# Patient Record
Sex: Female | Born: 1975 | Hispanic: Yes | Marital: Married | Smoking: Never smoker
Health system: Southern US, Community
[De-identification: ages and names within clinical notes are randomized; demographics above are authoritative.]

## PROBLEM LIST (undated history)

## (undated) HISTORY — PX: BREAST EXCISIONAL BIOPSY: SUR124

---

## 2001-07-27 HISTORY — PX: BREAST BIOPSY: SHX20

## 2005-05-02 ENCOUNTER — Inpatient Hospital Stay: Payer: Self-pay | Admitting: Obstetrics and Gynecology

## 2017-09-20 ENCOUNTER — Ambulatory Visit: Payer: Self-pay | Attending: Oncology | Admitting: *Deleted

## 2017-09-20 ENCOUNTER — Ambulatory Visit
Admission: RE | Admit: 2017-09-20 | Discharge: 2017-09-20 | Disposition: A | Discharge status: Home / Self Care | Payer: Self-pay | Source: Ambulatory Visit | Attending: Oncology | Admitting: Oncology

## 2017-09-20 ENCOUNTER — Encounter (INDEPENDENT_AMBULATORY_CARE_PROVIDER_SITE_OTHER): Payer: Self-pay

## 2017-09-20 VITALS — BP 110/79 | HR 76 | Temp 98.9°F | Ht 62.0 in | Wt 143.0 lb

## 2017-09-20 DIAGNOSIS — Z0000 Encounter for general adult medical examination without abnormal findings: Secondary | ICD-10-CM

## 2017-09-20 NOTE — Patient Instructions (Signed)
HPV Test The human papillomavirus (HPV) test is used to look for high-risk types of HPV infection. HPV is a group of about 100 viruses. Many of these viruses cause growths on, in, or around the genitals. Most HPV viruses cause infections that usually go away without treatment. However, HPV types 6, 11, 16, and 18 are considered high-risk types of HPV that can increase your risk of cancer of the cervix or anus if the infection is left untreated. An HPV test identifies the DNA (genetic) strands of the HPV infection, so it is also referred to as the HPV DNA test. Although HPV is found in both males and females, the HPV test is only used to screen for increased cancer risk in females:  With an abnormal Pap test.  After treatment of an abnormal Pap test.  Between the ages of 30 and 65.  After treatment of a high-risk HPV infection.  The HPV test may be done at the same time as a pelvic exam and Pap test in females over the age of 30. Both the HPV test and Pap test require a sample of cells from the cervix. How do I prepare for this test?  Do not douche or take a bath for 24-48 hours before the test or as directed by your health care provider.  Do not have sex for 24-48 hours before the test or as directed by your health care provider.  You may be asked to reschedule the test if you are menstruating.  You will be asked to urinate before the test. What do the results mean? It is your responsibility to obtain your test results. Ask the lab or department performing the test when and how you will get your results. Talk with your health care provider if you have any questions about your results. Your result will be negative or positive. Meaning of Negative Test Results A negative HPV test result means that no HPV was found, and it is very likely that you do not have HPV. Meaning of Positive Test Results A positive HPV test result indicates that you have HPV.  If your test result shows the presence  of any high-risk HPV strains, you may have an increased risk of developing cancer of the cervix or anus if the infection is left untreated.  If any low-risk HPV strains are found, you are not likely to have an increased risk of cancer.  Discuss your test results with your health care provider. He or she will use the results to make a diagnosis and determine a treatment plan that is right for you. Talk with your health care provider to discuss your results, treatment options, and if necessary, the need for more tests. Talk with your health care provider if you have any questions about your results. This information is not intended to replace advice given to you by your health care provider. Make sure you discuss any questions you have with your health care provider. Document Released: 08/07/2004 Document Revised: 03/18/2016 Document Reviewed: 11/28/2013 Elsevier Interactive Patient Education  2018 Elsevier Inc.   Gave patient hand-out, Women Staying Healthy, Active and Well from [facility], with education on breast health, pap smears, heart and colon health.  

## 2017-09-20 NOTE — Progress Notes (Signed)
Subjective:     Patient ID: Orion ModestGilma E Figueroa Castillo, female   DOB: Feb 09, 1976, 42 y.o.   MRN: [id number]  HPI   Review of Systems     Objective:   Physical Exam  Pulmonary/Chest: Right breast exhibits no inverted nipple, no mass, no nipple discharge, no skin change and no tenderness. Left breast exhibits no inverted nipple, no mass, no nipple discharge, no skin change and no tenderness. Breasts are symmetrical.  Abdominal: There is no splenomegaly or hepatomegaly.  Genitourinary: No labial fusion. There is no rash, tenderness, lesion or injury on the right labia. There is no rash, tenderness, lesion or injury on the left labia. Cervix exhibits no motion tenderness, no discharge and no friability. Right adnexum displays no mass, no tenderness and no fullness. Left adnexum displays no mass, no tenderness and no fullness. No erythema, tenderness or bleeding in the vagina. No signs of injury around the vagina. No vaginal discharge found.         Assessment:     42 year old English speaking [place] female [facility] breast exam, pap and mammogram.  Clinical breast exam unremarkable.  Taught self breast awareness.  [place].  Those images were not available per Asher MuirJamie in the breast center.  Last pap per patient was in 2016 or 2017 at [facility].  Per Joellyn Quailshristy Burton, Becky at the health department states a wet prep was obtained in 2016.  Specimen collected for pap smear without difficulty.  Patient has been screened for eligibility.  She does not have any insurance, Medicare or Medicaid.  She also meets financial eligibility.  Hand-out given on the Affordable Care Act.    Plan:     Screening mammogram ordered.  Specimen for pap sent to the lab.  Will follow-up per [facility] protocol.

## 2017-09-23 LAB — PAP LB AND HPV HIGH-RISK
HPV, high-risk: NEGATIVE
PAP SMEAR COMMENT: 0

## 2017-09-24 ENCOUNTER — Other Ambulatory Visit: Payer: Self-pay

## 2017-09-24 DIAGNOSIS — N63 Unspecified lump in unspecified breast: Secondary | ICD-10-CM

## 2017-10-01 ENCOUNTER — Ambulatory Visit
Admission: RE | Admit: 2017-10-01 | Discharge: 2017-10-01 | Disposition: A | Discharge status: Home / Self Care | Payer: Self-pay | Source: Ambulatory Visit | Attending: Oncology | Admitting: Oncology

## 2017-10-01 DIAGNOSIS — N63 Unspecified lump in unspecified breast: Secondary | ICD-10-CM

## 2017-10-25 ENCOUNTER — Other Ambulatory Visit: Payer: Self-pay | Admitting: *Deleted

## 2017-10-25 ENCOUNTER — Encounter: Payer: Self-pay | Admitting: *Deleted

## 2017-10-25 DIAGNOSIS — N63 Unspecified lump in unspecified breast: Secondary | ICD-10-CM

## 2017-10-25 NOTE — Progress Notes (Signed)
Letter mailed to inform patient of her normal pap smear, and appointment for her 6 month follow-up ultrasound on 04/05/18 @ 9:20.  HSIS to Winkelmanhristy.

## 2018-04-05 ENCOUNTER — Ambulatory Visit
Admission: RE | Admit: 2018-04-05 | Discharge: 2018-04-05 | Disposition: A | Discharge status: Home / Self Care | Payer: Self-pay | Source: Ambulatory Visit | Attending: Oncology | Admitting: Oncology

## 2018-04-05 DIAGNOSIS — N63 Unspecified lump in unspecified breast: Secondary | ICD-10-CM | POA: Insufficient documentation

## 2018-04-18 ENCOUNTER — Other Ambulatory Visit: Payer: Self-pay | Admitting: *Deleted

## 2018-04-18 ENCOUNTER — Encounter: Payer: Self-pay | Admitting: *Deleted

## 2018-04-18 DIAGNOSIS — N63 Unspecified lump in unspecified breast: Secondary | ICD-10-CM

## 2018-04-18 NOTE — Progress Notes (Unsigned)
Letter mailed to inform patient of her next [facility] appointment on 10/04/17 @ 9:30 and need for next mammogram.  HSIS to Cull[place]risty.

## 2018-10-05 ENCOUNTER — Ambulatory Visit: Payer: Self-pay | Attending: Oncology | Admitting: *Deleted

## 2018-10-05 ENCOUNTER — Ambulatory Visit
Admission: RE | Admit: 2018-10-05 | Discharge: 2018-10-05 | Disposition: A | Discharge status: Home / Self Care | Payer: Self-pay | Source: Ambulatory Visit | Attending: Oncology | Admitting: Oncology

## 2018-10-05 ENCOUNTER — Other Ambulatory Visit: Payer: Self-pay

## 2018-10-05 ENCOUNTER — Encounter: Payer: Self-pay | Admitting: *Deleted

## 2018-10-05 VITALS — BP 118/73 | HR 76 | Temp 98.4°F | Ht 62.25 in | Wt 147.2 lb

## 2018-10-05 DIAGNOSIS — N63 Unspecified lump in unspecified breast: Secondary | ICD-10-CM

## 2018-10-05 NOTE — Patient Instructions (Signed)
Gave patient hand-out, Women Staying Healthy, Active and Well from [facility], with education on breast health, pap smears, heart and colon health. 

## 2018-10-05 NOTE — Progress Notes (Signed)
  Subjective:     Patient ID: Yolanda Parsons, female   DOB: Feb 12, 1976, 43 y.o.   MRN: [id number]  HPI   Review of Systems     Objective:   Physical Exam Chest:     Breasts:        Right: No swelling, bleeding, inverted nipple, mass, nipple discharge, skin change or tenderness.        Left: No swelling, bleeding, inverted nipple, mass, nipple discharge, skin change or tenderness.    Lymphadenopathy:     Upper Body:     Right upper body: No supraclavicular or axillary adenopathy.     Left upper body: No supraclavicular or axillary adenopathy.        Assessment:     43 year old English speaking Hispanic female returns to [facility] for annual screening.  Clinical breast exam with bilateral thickening in the upper outer quadrants.  Patient is currently being followed with 6 month ultrasounds for a left breast mass at 1:00.  There is no dominant mass, skin changes, nipple discharge or lymphadenopathy.  Taught self breast awareness.  Last pap on 09/20/17 was negative / negative.  Next pap due in 2024.  Patient has been screened for eligibility.  She does not have any insurance, Medicare or Medicaid.  She also meets financial eligibility.  Hand-out given on the Affordable Care Act.  Risk Assessment    Risk Scores      10/05/2018   Last edited by: Alta Corning, CMA   5-year risk:    Lifetime risk:             Plan:     Bilateral diagnostic mammogram and ultrasound ordered for 6 month follow and annual screening of a left breast mass.  Will follow up per [facility] protocol.

## 2018-10-06 ENCOUNTER — Other Ambulatory Visit: Payer: Self-pay | Admitting: *Deleted

## 2018-10-06 ENCOUNTER — Encounter: Payer: Self-pay | Admitting: *Deleted

## 2018-10-06 DIAGNOSIS — N63 Unspecified lump in unspecified breast: Secondary | ICD-10-CM

## 2018-10-06 NOTE — Progress Notes (Signed)
Letter mailed from [facility] to inform patient of her mammogram results.  Patient is to follow-up in one year with annual diagnostic mammogram.  Yolanda Parsons to put in tickler file to schedule for annual appointment in one year.

## 2019-10-15 ENCOUNTER — Ambulatory Visit: Payer: Self-pay | Attending: Internal Medicine

## 2019-10-15 DIAGNOSIS — Z23 Encounter for immunization: Secondary | ICD-10-CM

## 2019-10-15 NOTE — Progress Notes (Signed)
   Covid-19 Vaccination Clinic  Name:  Yolanda Parsons    MRN: [id number] DOB: [birth year]  10/15/2019  Yolanda Parsons was observed post Covid-19 immunization for 15 minutes without incident. She was provided with Vaccine Information Sheet and instruction to access the V-Safe system.   Yolanda Parsons was instructed to call 911 with any severe reactions post vaccine: Marland Kitchen Difficulty breathing  . Swelling of face and throat  . A fast heartbeat  . A bad rash all over body  . Dizziness and weakness   Immunizations Administered    Name Date Dose VIS Date Route   Pfizer COVID-19 Vaccine 10/15/2019  1:55 PM 0.3 mL 07/07/2019 Intramuscular   Manufacturer: [employer], [employer]   Lot: [id number]   NDC: [address]-2

## 2019-11-05 ENCOUNTER — Ambulatory Visit: Payer: Self-pay | Attending: Internal Medicine

## 2019-11-05 DIAGNOSIS — Z23 Encounter for immunization: Secondary | ICD-10-CM

## 2019-11-05 NOTE — Progress Notes (Signed)
   Covid-19 Vaccination Clinic  Name:  Yolanda Parsons    MRN: [id number] DOB: 06-04-1976  11/05/2019  [name] was observed post Covid-19 immunization for 15 minutes without incident. She was provided with Vaccine Information Sheet and instruction to access the V-Safe system.   [name] was instructed to call 911 with any severe reactions post vaccine: Marland Kitchen Difficulty breathing  . Swelling of face and throat  . A fast heartbeat  . A bad rash all over body  . Dizziness and weakness   Immunizations Administered    Name Date Dose VIS Date Route   Pfizer COVID-19 Vaccine 11/05/2019  1:03 PM 0.3 mL 07/07/2019 Intramuscular   Manufacturer: [employer], [employer]   Lot: [id number]   NDC: [address]-8

## 2020-04-14 IMAGING — MG DIGITAL DIAGNOSTIC BILATERAL MAMMOGRAM WITH TOMO AND CAD
8 series · 8 of 24 positions shown · non-contrast
Comparison: Previous exam(s).

CLINICAL DATA: 42-year-old female for 1 year follow-up of LEFT
breast mass and for annual bilateral mammogram.

EXAM:
DIGITAL DIAGNOSTIC BILATERAL MAMMOGRAM WITH CAD AND TOMO
ULTRASOUND LEFT BREAST

[L MLO synth-2D]
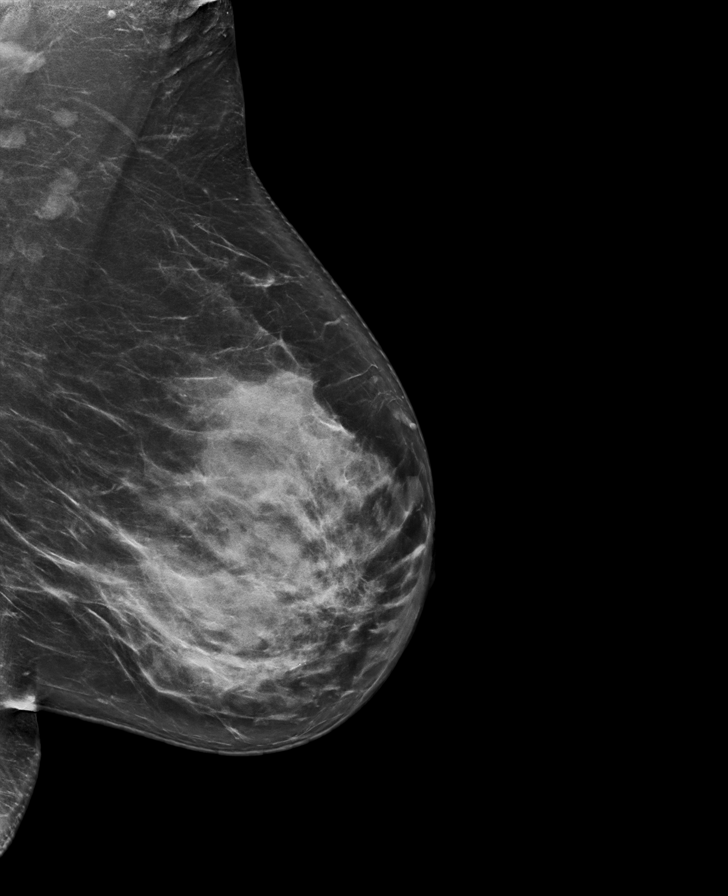

[R MLO synth-2D]
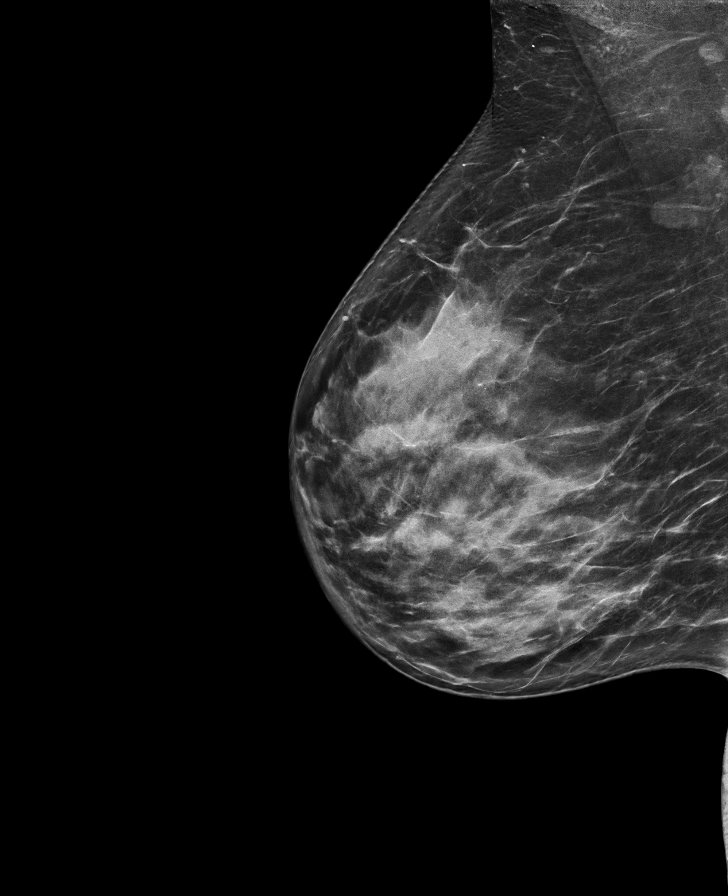

[L CC synth-2D]
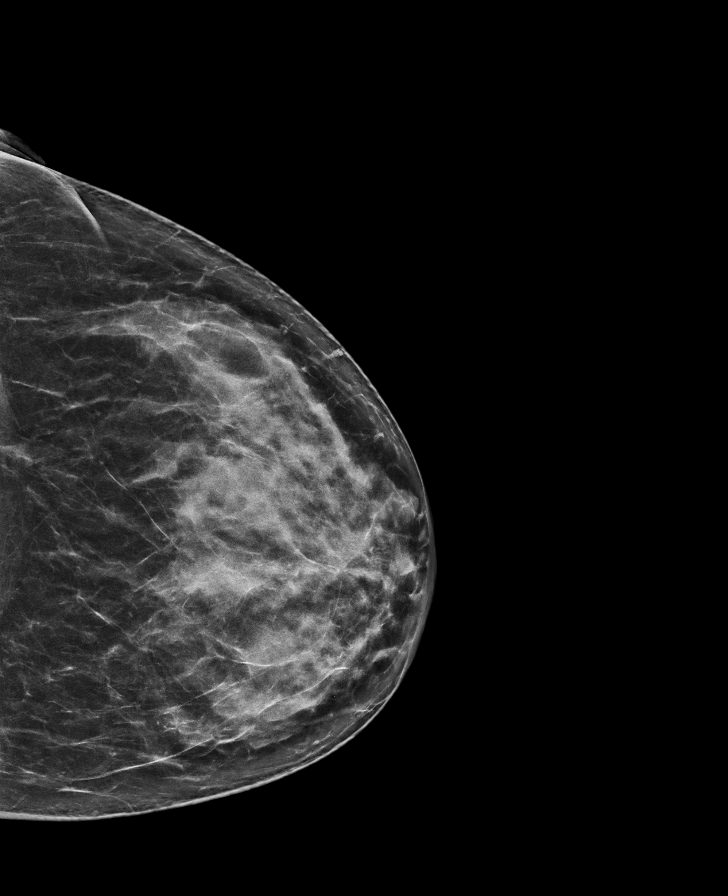

[R CC synth-2D]
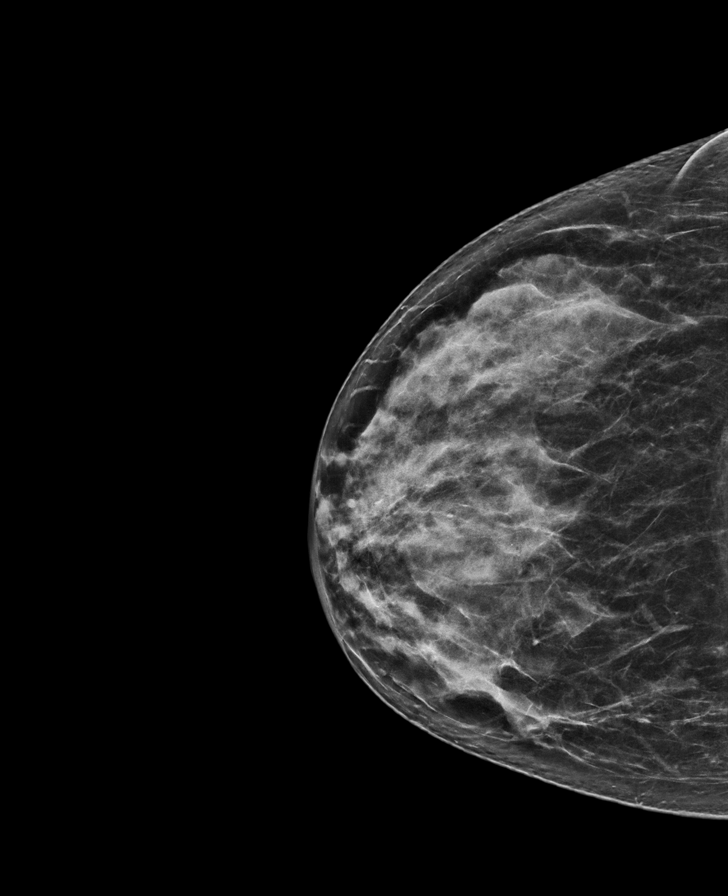

[L MLO tomo · tomo slice 43/85.0]
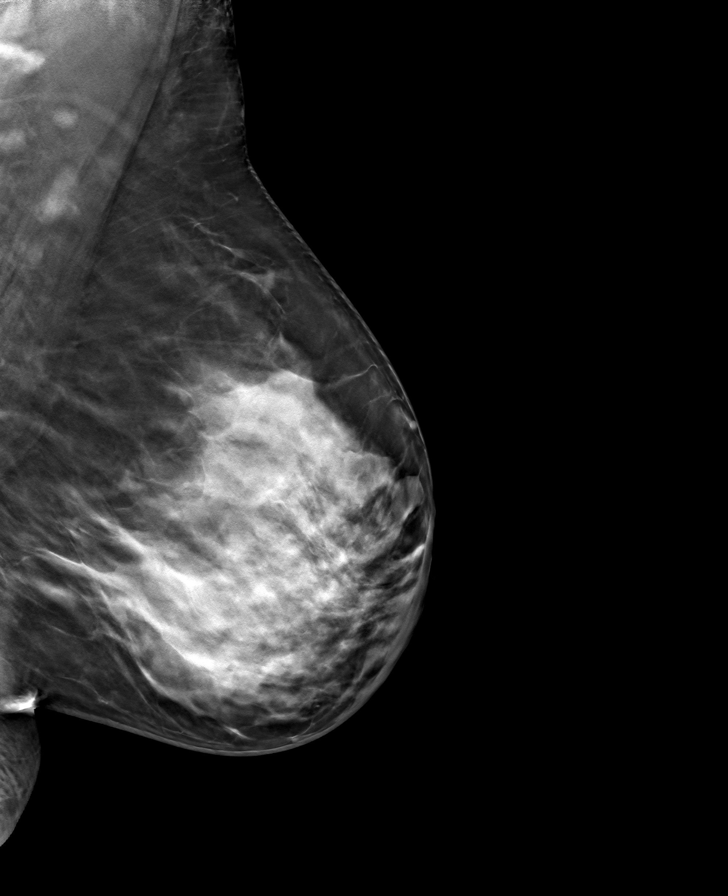

[L CC tomo · tomo slice 40/79.0]
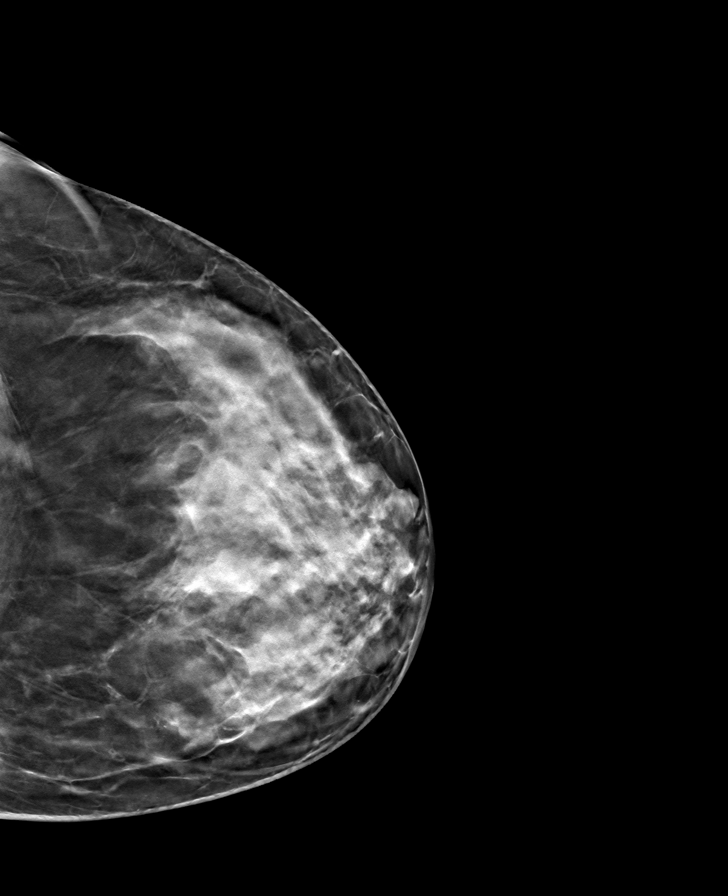

[R MLO tomo · tomo slice 39/77.0]
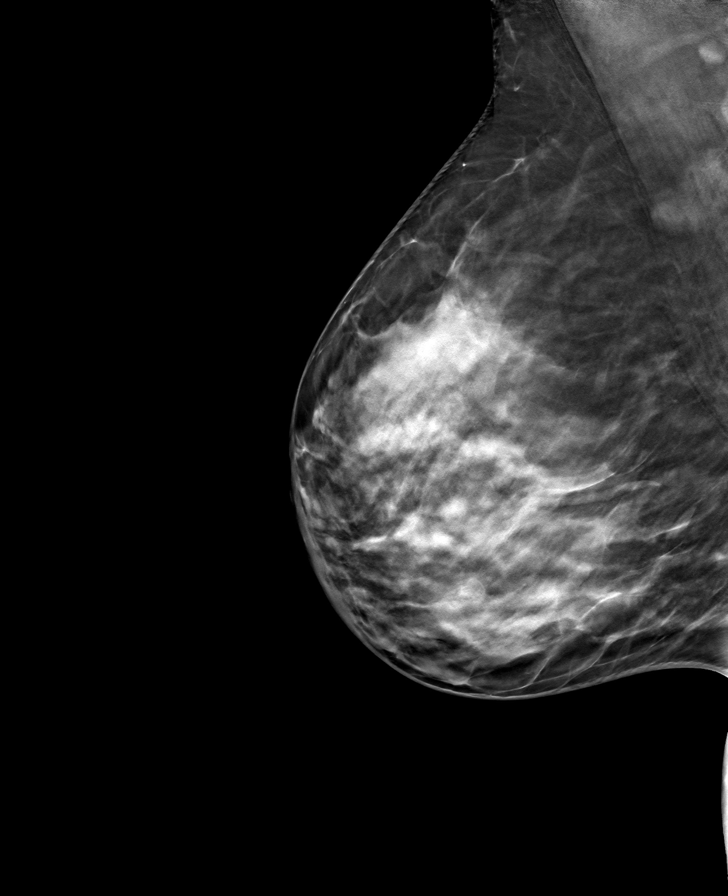

[R CC tomo · tomo slice 37/73.0]
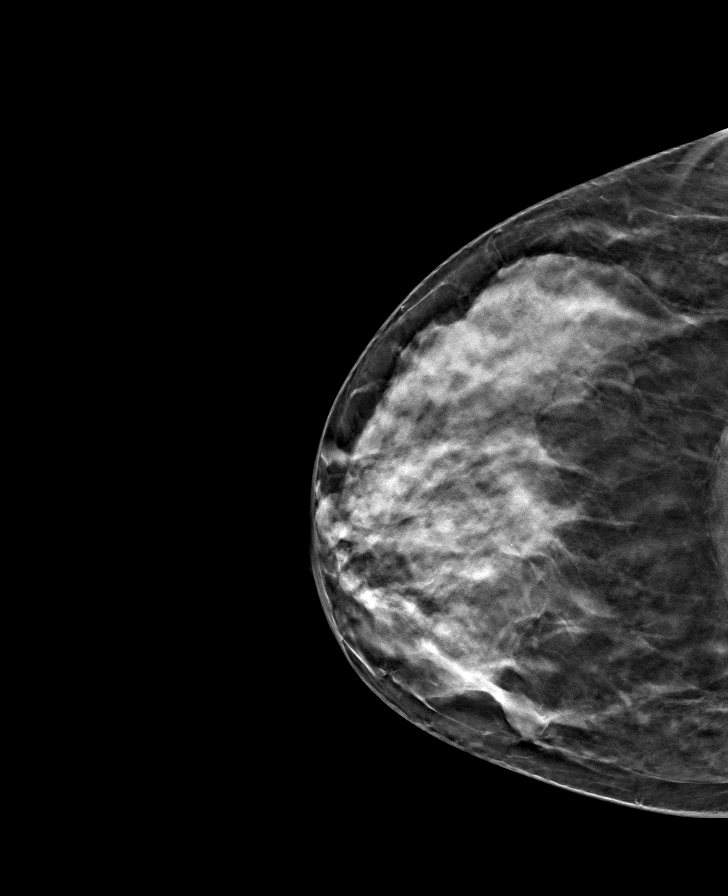

[8 of 24 positions shown; findings below may reference images not displayed]

ACR Breast Density Category c: The breast tissue is heterogeneously
dense, which may obscure small masses.
FINDINGS: 2D/3D full field views of both breast demonstrate a stable
circumscribed oval mass within the anterior UPPER-OUTER LEFT breast.

No new mass, distortion or worrisome calcifications noted

Mammographic images were processed with CAD.

Targeted ultrasound is performed, showing a stable 1.2 x 0.6 x
cm circumscribed oval hypoechoic parallel mass at the 1 o'clock
position of the RETROAREOLAR LEFT breast.
IMPRESSION: 1. Stable likely benign mass/fibroadenoma within the UPPER-OUTER
RETROAREOLAR LEFT breast. One year follow-up recommended to ensure 2
year stability.
2. No mammographic evidence of breast malignancy.

RECOMMENDATION:
Bilateral diagnostic mammogram with LEFT breast ultrasound in 1
year.

I have discussed the findings and recommendations with the patient.
Results were also provided in writing at the conclusion of the
visit. If applicable, a reminder letter will be sent to the patient
regarding the next appointment.

BI-RADS CATEGORY  3: Probably benign.

## 2020-05-15 ENCOUNTER — Ambulatory Visit: Payer: Self-pay | Attending: Oncology | Admitting: *Deleted

## 2020-05-15 ENCOUNTER — Ambulatory Visit
Admission: RE | Admit: 2020-05-15 | Discharge: 2020-05-15 | Disposition: A | Discharge status: Home / Self Care | Payer: Self-pay | Source: Ambulatory Visit | Attending: Oncology | Admitting: Oncology

## 2020-05-15 ENCOUNTER — Other Ambulatory Visit: Payer: Self-pay

## 2020-05-15 ENCOUNTER — Encounter: Payer: Self-pay | Admitting: *Deleted

## 2020-05-15 VITALS — BP 110/49 | HR 69 | Temp 98.7°F | Wt 149.7 lb

## 2020-05-15 DIAGNOSIS — N63 Unspecified lump in unspecified breast: Secondary | ICD-10-CM | POA: Insufficient documentation

## 2020-05-15 NOTE — Patient Instructions (Signed)
Gave patient hand-out, Women Staying Healthy, Active and Well from [facility], with education on breast health, pap smears, heart and colon health. 

## 2020-05-15 NOTE — Progress Notes (Signed)
Subjective:     Patient ID: Yolanda Parsons, female   DOB: 06/06/1976, 44 y.o.   MRN: [id number]  HPI   [facility] Medical History Record - 05/15/20 0102      Breast History   Screening cycle Rescreen    CBE Date 10/05/18    Provider (CBE) [facility]    Initial Mammogram 05/15/20    Last Mammogram Annual    Last Mammogram Date 10/05/18    Provider (Mammogram)  Delford Field    Recent Breast Symptoms None   bilateral tenderness     Breast Cancer History   Breast Cancer History No personal or family history      Previous History of Breast Problems   Breast Surgery or Biopsy Right   2002   BSE Done Monthly      Gynecological/Obstetrical History   LMP --   unk - patient uses an IUD   Is there any chance that the client could be pregnant?  No    Age at menarche 44    Age at menopause na    PAP smear history Annually    Date of last PAP  09/20/17    Provider (PAP) [facility]    Age at first live birth 44    Breast fed children Yes (type length in comments)    DES Exposure No    Cervical, Uterine or Ovarian cancer No    Family history of Cervial, Uterine or Ovarian cancer Yes   maternal aunt had cervical cancer   Hysterectomy No    Cervix removed No    Ovaries removed No    Laser/Cryosurgery No    Current method of birth control Other (see comments)   IUD   Current method of Estrogen/Hormone replacement None    Smoking history None            Review of Systems     Objective:   Physical Exam Chest:     Breasts:        Right: Tenderness present. No swelling, bleeding, inverted nipple, mass, nipple discharge or skin change.        Left: Tenderness present. No swelling, bleeding, inverted nipple, mass, nipple discharge or skin change.    Lymphadenopathy:     Upper Body:     Right upper body: No supraclavicular or axillary adenopathy.     Left upper body: No supraclavicular or axillary adenopathy.        Assessment:     44 year old [place] female returns to [facility]  for annual screening.   Last mammogram was a birads 3 in 2020.  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  Patient states she has bilateral breast tenderness, similar to before she has a period.  Currently she has an IUD and does not have her menstrual cycles.  On clinical breast exam there is no dominant mass, skin changes, nipple discharge or lymphadenopathy.  Bilateral upper outer quadrants with thickening.  Taught self breast awareness.  Last pap on 09/20/17 was negative / negative.  Next pap due in 2024.  Patient has been screened for eligibility.  She does not have any insurance, Medicare or Medicaid.  She also meets financial eligibility.   Risk Assessment    Risk Scores      05/15/2020 10/05/2018   Last edited by: Jim Like, RN [place], Freada Bergeron, CMA   5-year risk:     Lifetime risk:  Plan:     Bilateral diagnostic mammogram and ultrasound ordered.  Will follow up per [facility] protocol.

## 2020-05-16 ENCOUNTER — Encounter: Payer: Self-pay | Admitting: *Deleted

## 2020-05-16 NOTE — Progress Notes (Signed)
Letter mailed from [facility] to inform patient of her normal mammogram results.  Patient is to follow-up with annual screening in one year. 

## 2021-09-10 ENCOUNTER — Other Ambulatory Visit: Payer: Self-pay

## 2021-09-10 DIAGNOSIS — Z1231 Encounter for screening mammogram for malignant neoplasm of breast: Secondary | ICD-10-CM

## 2021-10-21 ENCOUNTER — Other Ambulatory Visit: Payer: Self-pay

## 2021-10-21 DIAGNOSIS — Z1211 Encounter for screening for malignant neoplasm of colon: Secondary | ICD-10-CM

## 2021-10-22 ENCOUNTER — Other Ambulatory Visit: Payer: Self-pay

## 2021-10-22 ENCOUNTER — Ambulatory Visit
Admission: RE | Admit: 2021-10-22 | Discharge: 2021-10-22 | Disposition: A | Discharge status: Home / Self Care | Payer: Self-pay | Source: Ambulatory Visit | Attending: Obstetrics and Gynecology | Admitting: Obstetrics and Gynecology

## 2021-10-22 ENCOUNTER — Ambulatory Visit: Payer: Self-pay | Attending: Hematology and Oncology | Admitting: *Deleted

## 2021-10-22 VITALS — BP 100/44 | HR 68 | Resp 18 | Wt 156.0 lb

## 2021-10-22 DIAGNOSIS — Z1239 Encounter for other screening for malignant neoplasm of breast: Secondary | ICD-10-CM

## 2021-10-22 DIAGNOSIS — Z1231 Encounter for screening mammogram for malignant neoplasm of breast: Secondary | ICD-10-CM

## 2021-10-22 NOTE — Progress Notes (Signed)
[name] is a 46 y.o. female who presents to [facility] clinic today with no complaints.  ?  ?Pap Smear: Pap smear not completed today. Last Pap smear was 09/20/2017 at [facility] clinic and was normal with negative HPV. Per patient has history of an abnormal Pap smear 10 years ago that a colposcopy was completed for follow up. Patient states all Pap smears have been normal since colposcopy and that she has had at least three normal Pap smears. Last Pap smear result is available in Epic. ?  ?Physical exam: ?Breasts ?Breasts symmetrical. No skin abnormalities bilateral breasts. No nipple retraction bilateral breasts. No nipple discharge bilateral breasts. No lymphadenopathy. No lumps palpated bilateral breasts. No complaints of pain or tenderness on exam.     ? ?MS DIGITAL SCREENING TOMO BILATERAL ? ?Result Date: 09/23/2017 ?CLINICAL DATA:  Screening. EXAM: DIGITAL SCREENING BILATERAL MAMMOGRAM WITH TOMO AND CAD COMPARISON:  No prior films ACR Breast Density Category c: The breast tissue is heterogeneously dense, which may obscure small masses. FINDINGS: In the left breast, a possible mass warrants further evaluation. In the right breast, no findings suspicious for malignancy. Images were processed with CAD. IMPRESSION: Further evaluation is suggested for possible mass in the left breast. RECOMMENDATION: Diagnostic mammogram and possibly ultrasound of the left breast. (Code:[id number]) The patient will be contacted regarding the findings, and additional imaging will be scheduled. BI-RADS CATEGORY  0: Incomplete. Need additional imaging evaluation and/or prior mammograms for comparison. Electronically Signed   By: Sherian Rein M.D.   On: 09/23/2017 09:26  ? ?MS DIGITAL DIAG TOMO BILAT ? ?Result Date: 05/15/2020 ?CLINICAL DATA:  46 year old female presenting for follow-up of a left breast mass. EXAM: DIGITAL DIAGNOSTIC BILATERAL MAMMOGRAM WITH TOMO AND CAD; ULTRASOUND LEFT BREAST LIMITED COMPARISON:   Previous exam(s). ACR Breast Density Category c: The breast tissue is heterogeneously dense, which may obscure small masses. FINDINGS: No suspicious calcifications, masses or areas of distortion are seen in the bilateral breasts. Mammographic images were processed with CAD. Ultrasound targeted to the left breast in the retroareolar position at 1 o'clock demonstrates a stable oval hypoechoic mass measuring 1.3 x 0.6 x 1.2 cm, previously measuring 1.2 x 0.6 x 1.2 cm. IMPRESSION: 1. The retroareolar left breast mass at 1 o'clock has demonstrated 2 years of stability, and is therefore a benign finding. 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 2.  No mammographic evidence of malignancy in the bilateral breasts. RECOMMENDATION: Screening mammogram in one year.(Code:[id number]-01Y) I have discussed the findings and recommendations with the patient. If applicable, a reminder letter will be sent to the patient regarding the next appointment. BI-RADS CATEGORY  2: Benign. Electronically Signed   By: Frederico Hamman M.D.   On: 05/15/2020 09:39  ? ?MS DIGITAL DIAG TOMO BILAT ? ?Result Date: 10/05/2018 ?CLINICAL DATA:  46 year old female for 1 year follow-up of LEFT breast mass and for annual bilateral mammogram. EXAM: DIGITAL DIAGNOSTIC BILATERAL MAMMOGRAM WITH CAD AND TOMO ULTRASOUND LEFT BREAST COMPARISON:  Previous exam(s). ACR Breast Density Category c: The breast tissue is heterogeneously dense, which may obscure small masses. FINDINGS: 2D/3D full field views of both breast demonstrate a stable circumscribed oval mass within the anterior UPPER-OUTER LEFT breast. No new mass, distortion or worrisome calcifications noted Mammographic images were processed with CAD. Targeted ultrasound is performed, showing a stable 1.2 x 0.6 x 1.2 cm circumscribed oval hypoechoic parallel mass at the 1 o'clock position of the RETROAREOLAR LEFT breast. IMPRESSION: 1. Stable likely benign mass/fibroadenoma within the  UPPER-OUTER RETROAREOLAR LEFT  breast. One year follow-up recommended to ensure 2 year stability. 2. No mammographic evidence of breast malignancy. RECOMMENDATION: Bilateral diagnostic mammogram with LEFT breast ultrasound in 1 year. I have discussed the findings and recommendations with the patient. Results were also provided in writing at the conclusion of the visit. If applicable, a reminder letter will be sent to the patient regarding the next appointment. BI-RADS CATEGORY  3: Probably benign. Electronically Signed   By: Harmon Pier M.D.   On: 10/05/2018 10:58  ? ?MS DIGITAL DIAG TOMO UNI LEFT ? ?Result Date: 10/01/2017 ?CLINICAL DATA:  Possible mass in the anterior aspect of the upper-outer left breast on a recent screening mammogram. 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 EXAM: DIGITAL DIAGNOSTIC LEFT MAMMOGRAM WITH TOMO ULTRASOUND LEFT BREAST COMPARISON:  Baseline screening mammogram dated 09/20/2017. ACR Breast Density Category c: The breast tissue is heterogeneously dense, which may obscure small masses. FINDINGS: 2D and 3D spot compression views of the left breast confirm an oval, circumscribed and partially obscured mass in the anterior aspect of the upper-outer left breast in the periareolar region. Targeted ultrasound is performed, showing a 1.2 x 1.2 x 0.6 cm oval, horizontally oriented, circumscribed, hypoechoic mass in the anterior 1 o'clock retroareolar left breast. This corresponds to the mammographic mass. No internal blood flow was seen with power Doppler. This contains a thin partial internal septation. Ultrasound of the left axilla demonstrated normal appearing left axillary lymph nodes. IMPRESSION: 1.2 cm probable fibroadenoma in the 1 o'clock retroareolar left breast. RECOMMENDATION: Left breast ultrasound  in 6 months. The option of ultrasound-guided core needle biopsy was also discussed with the patient but not recommended at this time. She is currently comfortable with the 6 month followup ultrasound. I have discussed the findings and recommendations with the patient. Results were also provided in writing at the conclusion of the visit. If applicable, a reminder letter will be sent to the patient regarding the next appointment. BI-RADS CATEGORY  3: Probably benign. Electronically Signed   By: Beckie Salts M.D.   On: 10/01/2017 12:22   ?  ?Pelvic/Bimanual ?Pap is not indicated today per [facility] guidelines. ?  ?Smoking History: ?Patient has never smoked. ?  ?Patient Navigation: ?Patient education provided. Access to services provided for patient through [employer] program. Spanish interpreter Kirke Shaggy from [facility] provided.  ? ?Colorectal Cancer Screening: ?Per patient has never had colonoscopy completed. Patient stated her PCP has referred her to have a colonoscopy completed. No complaints today.  ?  ?Breast and Cervical Cancer Risk Assessment: ?Patient does not have family history of breast cancer, known genetic mutations, or radiation treatment to the chest before age 18. Per patient has history of cervical dysplasia. Patient has no history of being immunocompromised or DES exposure in-utero. ? ?Risk Assessment   ? ? Risk Scores   ? ?   10/22/2021 05/15/2020  ? Last edited by: Lesle Chris, RN Jim Like, RN  ? 5-year risk:    ? Lifetime risk:    ? ?  ?  ? ?  ? ? ?A: ?[facility] exam without pap smear ?No complaints. ? ?P: ?Referred patient to the [facility] for a screening mammogram. Appointment scheduled Wednesday, October 22, 2021 at 1240. ? ?Priscille Heidelberg, RN ?10/22/2021 11:25  AM   ?

## 2021-10-22 NOTE — Patient Instructions (Signed)
Explained breast self awareness with Felton Marikay Alar. Patient did not need a Pap smear today due to last Pap smear and HPV typing was 09/20/2017. Let her know [facility] will cover Pap smears and HPV typing every 5 years unless has a history of abnormal Pap smears. Referred patient to the [facility] for a screening mammogram. Appointment scheduled Wednesday, October 22, 2021 at 1240. Patient aware of appointment and will be there. Let patient know Hartford Poli will follow up with her within the next couple weeks with results of her mammogram by letter or phone. [place] verbalized understanding. ? ?Sujata Maines, Arvil Chaco, RN ?11:25 AM ? ? ? ? ?

## 2022-11-03 ENCOUNTER — Other Ambulatory Visit: Payer: Self-pay

## 2022-11-03 DIAGNOSIS — Z1231 Encounter for screening mammogram for malignant neoplasm of breast: Secondary | ICD-10-CM

## 2022-11-10 ENCOUNTER — Ambulatory Visit: Payer: Self-pay | Attending: Hematology and Oncology | Admitting: Hematology and Oncology

## 2022-11-10 ENCOUNTER — Ambulatory Visit
Admission: RE | Admit: 2022-11-10 | Discharge: 2022-11-10 | Disposition: A | Discharge status: Home / Self Care | Payer: Self-pay | Source: Ambulatory Visit | Attending: Obstetrics and Gynecology | Admitting: Obstetrics and Gynecology

## 2022-11-10 ENCOUNTER — Other Ambulatory Visit: Payer: Self-pay

## 2022-11-10 VITALS — BP 113/61 | Wt 154.5 lb

## 2022-11-10 DIAGNOSIS — Z1231 Encounter for screening mammogram for malignant neoplasm of breast: Secondary | ICD-10-CM

## 2022-11-10 DIAGNOSIS — Z1211 Encounter for screening for malignant neoplasm of colon: Secondary | ICD-10-CM

## 2022-11-10 NOTE — Patient Instructions (Signed)
Taught Yolanda Parsons about self breast awareness and gave educational materials to take home. Patient did not need a Pap smear today due to last Pap smear was in 2023 per patient. Let her know [facility] will cover Pap smears every 5 years unless has a history of abnormal Pap smears. [facility] to the [facility] for [facility] mammogram. Appointment scheduled for 11/10/22. Patient aware of appointment and will be there. Let patient know will follow up with her within the next couple weeks with results. Yolanda Parsons verbalized understanding.  [name], NP 1:33 PM

## 2022-11-10 NOTE — Progress Notes (Signed)
[name] is a 47 y.o. female who presents to [facility] clinic today with no complaints.    Pap Smear: Pap not smear completed today. Last Pap smear was 2023 at [facility] clinic and was normal. Per patient has history of an abnormal Pap smear. Last Pap smear result is available in Epic. Abnormal in 2005 with 3 normals since then.    Physical exam: Breasts Breasts symmetrical. No skin abnormalities bilateral breasts. No nipple retraction bilateral breasts. No nipple discharge bilateral breasts. No lymphadenopathy. No lumps palpated bilateral breasts.   MS DIGITAL SCREENING TOMO BILATERAL  Result Date: 10/22/2021 CLINICAL DATA:  Screening. EXAM: DIGITAL SCREENING BILATERAL MAMMOGRAM WITH TOMOSYNTHESIS AND CAD TECHNIQUE: Bilateral screening digital craniocaudal and mediolateral oblique mammograms were obtained. Bilateral screening digital breast tomosynthesis was performed. The images were evaluated with computer-aided detection. COMPARISON:  Previous exam(s). ACR Breast Density Category c: The breast tissue is heterogeneously dense, which may obscure small masses. FINDINGS: There are no findings suspicious for malignancy. IMPRESSION: No mammographic evidence of malignancy. A result letter of this screening mammogram will be mailed directly to the patient. RECOMMENDATION: Screening mammogram in one year. (Code:SM-B-01Y) BI-RADS CATEGORY  1: Negative. Electronically Signed   By: Gerome Sam III M.D.   On: 10/22/2021 15:26   MS DIGITAL DIAG TOMO BILAT  Result Date: 05/15/2020 CLINICAL DATA:  47 year old female presenting for follow-up of a left breast mass. EXAM: DIGITAL DIAGNOSTIC BILATERAL MAMMOGRAM WITH TOMO AND CAD; ULTRASOUND LEFT BREAST LIMITED COMPARISON:  Previous exam(s). ACR Breast Density Category c: The breast tissue is heterogeneously dense, which may obscure small masses. FINDINGS: No suspicious calcifications, masses or areas of distortion are seen in the  bilateral breasts. Mammographic images were processed with CAD. Ultrasound targeted to the left breast in the retroareolar position at 1 o'clock demonstrates a stable oval hypoechoic mass measuring 1.3 x 0.6 x 1.2 cm, previously measuring 1.2 x 0.6 x 1.2 cm. IMPRESSION: 1. The retroareolar left breast mass at 1 o'clock has demonstrated 2 years of stability, and is therefore a benign finding. 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 2.  No mammographic evidence of malignancy in the bilateral breasts. RECOMMENDATION: Screening mammogram in one year.(Code:SM-B-01Y) I have discussed the findings and recommendations with the patient. If applicable, a reminder letter will be sent to the patient regarding the next appointment. BI-RADS CATEGORY  2: Benign. Electronically Signed   By: Frederico Hamman M.D.   On: 05/15/2020 09:39   MS DIGITAL DIAG TOMO BILAT  Result Date: 10/05/2018 CLINICAL DATA:  47 year old female for 1 year follow-up of LEFT breast mass and for annual bilateral mammogram. EXAM: DIGITAL DIAGNOSTIC BILATERAL MAMMOGRAM WITH CAD AND TOMO ULTRASOUND LEFT BREAST COMPARISON:  Previous exam(s). ACR Breast Density Category c: The breast tissue is heterogeneously dense, which may obscure small masses. FINDINGS: 2D/3D full field views of both breast demonstrate a stable circumscribed oval mass within the anterior UPPER-OUTER LEFT breast. No new mass, distortion or worrisome calcifications noted Mammographic images were processed with CAD. Targeted ultrasound is performed, showing a stable 1.2 x 0.6 x 1.2 cm circumscribed oval hypoechoic parallel mass at the 1 o'clock position of the RETROAREOLAR LEFT breast. IMPRESSION: 1. 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 One year follow-up recommended to ensure 2 year stability. 2. No mammographic evidence of breast malignancy. RECOMMENDATION: Bilateral diagnostic mammogram with LEFT breast ultrasound in 1 year. I have  discussed the findings and recommendations with the patient. Results were also provided in writing  at the conclusion of the visit. If applicable, a reminder letter will be sent to the patient regarding the next appointment. BI-RADS CATEGORY  3: Probably benign. Electronically Signed   By: Harmon Pier M.D.   On: 10/05/2018 10:58        Pelvic/Bimanual Pap is not indicated today    Smoking History: Patient has never smoked and was not referred to quit line.    Patient Navigation: Patient education provided. Access to services provided for patient through [facility] program. No interpreter provided. No transportation provided   Colorectal Cancer Screening: Per patient has never had colonoscopy completed No complaints today.    Breast and Cervical Cancer Risk Assessment: Patient does not have family history of breast cancer, known genetic mutations, or radiation treatment to the chest before age 5. Patient has history of cervical dysplasia, immunocompromised, or DES exposure in-utero.  Risk Scores as of 11/10/2022     Yolanda Parsons           5-year 2.1 %   Lifetime 15.64 %   This patient is [place]/Lat[place] but has no documented birth country, so the [place] model used data from [place] patients to calculate their risk score. Document a birth country in the Demographics activity for a more accurate score.         Last calculated by Narda Rutherford, LPN on 09/09/863 at  1:18 PM        A: BCCCP exam without pap smear No complaints with benign exam.   P: [facility] Appointment scheduled 11/10/22.  Ilda Basset A, NP 11/10/2022 1:31 PM 11/10/22

## 2022-12-26 LAB — FECAL OCCULT BLOOD, IMMUNOCHEMICAL: Fecal Occult Bld: NEGATIVE

## 2024-01-11 ENCOUNTER — Encounter: Payer: Self-pay | Admitting: Student

## 2024-02-14 ENCOUNTER — Other Ambulatory Visit: Payer: Self-pay

## 2024-02-14 DIAGNOSIS — Z1231 Encounter for screening mammogram for malignant neoplasm of breast: Secondary | ICD-10-CM

## 2024-02-29 ENCOUNTER — Other Ambulatory Visit: Payer: Self-pay

## 2024-02-29 ENCOUNTER — Ambulatory Visit: Payer: Self-pay

## 2024-02-29 ENCOUNTER — Ambulatory Visit: Payer: Self-pay | Attending: Obstetrics and Gynecology | Admitting: *Deleted

## 2024-02-29 VITALS — BP 118/74 | Ht 62.0 in | Wt 148.9 lb

## 2024-02-29 DIAGNOSIS — N644 Mastodynia: Secondary | ICD-10-CM

## 2024-02-29 DIAGNOSIS — B372 Candidiasis of skin and nail: Secondary | ICD-10-CM

## 2024-02-29 DIAGNOSIS — R2231 Localized swelling, mass and lump, right upper limb: Secondary | ICD-10-CM

## 2024-02-29 DIAGNOSIS — Z1211 Encounter for screening for malignant neoplasm of colon: Secondary | ICD-10-CM

## 2024-02-29 DIAGNOSIS — N6310 Unspecified lump in the right breast, unspecified quadrant: Secondary | ICD-10-CM

## 2024-02-29 DIAGNOSIS — Z1239 Encounter for other screening for malignant neoplasm of breast: Secondary | ICD-10-CM

## 2024-02-29 MED ORDER — NYSTATIN 100000 UNIT/GM EX POWD: 1.0000 | Freq: Three times a day (TID) | CUTANEOUS | 0 refills | Status: AC

## 2024-02-29 NOTE — Progress Notes (Signed)
 Yolanda Parsons is a 48 y.o. female who presents to [facility] clinic today with complaint of left breast lump that has been followed since 2019 per patient. In 2021 it was noted benign with a screening mammogram recommended in 1 year. Last screening mammogram was completed 11/10/2022 and negative.    Pap Smear: Pap smear not completed today. Last Pap smear was 11/04/2021 at [facility] clinic and was normal with negative HPV. Per patient has no history of an abnormal Pap smear. Last Pap smear result  Epic.   Physical exam: Breasts Breasts symmetrical. No skin abnormalities bilateral breasts. No nipple retraction bilateral breasts. No nipple discharge bilateral breasts. No lymphadenopathy. Palpated a lump within the left axilla at 11 o'clock 15 cm from the nipple. Unable to palpate a lump within the left breast at patients area of concern. Complaints of left outer breast pain on exam. Complaints of tenderness when palpated right axillary lump on exam.  MS 3D SCR MAMMO BILAT BR (aka MM) Result Date: 11/11/2022 CLINICAL DATA:  Screening. EXAM: DIGITAL SCREENING BILATERAL MAMMOGRAM WITH TOMOSYNTHESIS AND CAD TECHNIQUE: Bilateral screening digital craniocaudal and mediolateral oblique mammograms were obtained. Bilateral screening digital breast tomosynthesis was performed. The images were evaluated with computer-aided detection. COMPARISON:  Previous exam(s). ACR Breast Density Category c: The breasts are heterogeneously dense, which may obscure small masses. FINDINGS: There are no findings suspicious for malignancy. IMPRESSION: No mammographic evidence of malignancy. A result letter of this screening mammogram will be mailed directly to the patient. RECOMMENDATION: Screening mammogram in one year. (Code:SM-B-01Y) BI-RADS CATEGORY  1: Negative. Electronically Signed   By: Lael Hines M.D.   On: 11/11/2022 14:02   MS DIGITAL SCREENING TOMO BILATERAL Result Date:  10/22/2021 CLINICAL DATA:  Screening. EXAM: DIGITAL SCREENING BILATERAL MAMMOGRAM WITH TOMOSYNTHESIS AND CAD TECHNIQUE: Bilateral screening digital craniocaudal and mediolateral oblique mammograms were obtained. Bilateral screening digital breast tomosynthesis was performed. The images were evaluated with computer-aided detection. COMPARISON:  Previous exam(s). ACR Breast Density Category c: The breast tissue is heterogeneously dense, which may obscure small masses. FINDINGS: There are no findings suspicious for malignancy. IMPRESSION: No mammographic evidence of malignancy. A result letter of this screening mammogram will be mailed directly to the patient. RECOMMENDATION: Screening mammogram in one year. (Code:SM-B-01Y) BI-RADS CATEGORY  1: Negative. Electronically Signed   By: Alm Pouch III M.D.   On: 10/22/2021 15:26   MS DIGITAL DIAG TOMO BILAT Result Date: 05/15/2020 CLINICAL DATA:  48 year old female presenting for follow-up of a left breast mass. EXAM: DIGITAL DIAGNOSTIC BILATERAL MAMMOGRAM WITH TOMO AND CAD; ULTRASOUND LEFT BREAST LIMITED COMPARISON:  Previous exam(s). ACR Breast Density Category c: The breast tissue is heterogeneously dense, which may obscure small masses. FINDINGS: No suspicious calcifications, masses or areas of distortion are seen in the bilateral breasts. Mammographic images were processed with CAD. Ultrasound targeted to the left breast in the retroareolar position at 1 o'clock demonstrates a stable oval hypoechoic mass measuring 1.3 x 0.6 x 1.2 cm, previously measuring 1.2 x 0.6 x 1.2 cm. IMPRESSION: 1. The retroareolar left breast mass at 1 o'clock has demonstrated 2 years of stability, and is therefore a benign finding. 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 2.  No mammographic evidence of malignancy in the bilateral breasts. RECOMMENDATION: Screening mammogram in one year.(Code:SM-B-01Y) I have discussed the findings and recommendations with the patient. If applicable, a  reminder letter will be sent to the patient regarding the next appointment. BI-RADS CATEGORY  2: Benign. Electronically  Signed   By: Rosaline Collet M.D.   On: 05/15/2020 09:39   Pelvic/Bimanual Pap is not indicated today per BCCCP guidelines.   Smoking History: Patient has never smoked.   Patient Navigation: Patient education provided. Access to services provided for patient through BCCCP program.   Colorectal Cancer Screening: Per patient has never had colonoscopy completed. FIT Test given to patient to complete. No complaints today.    Breast and Cervical Cancer Risk Assessment: Patient does not have family history of breast cancer, known genetic mutations, or radiation treatment to the chest before age 70. Patient does not have history of cervical dysplasia, immunocompromised, or DES exposure in-utero.  Risk Scores as of Encounter on 02/29/2024     [place] as of 11/10/2022           5-year 2.1%   Lifetime 15.64%   This patient is [place]/Lat[place] but has no documented birth country, so the [place] model used data from [place] patients to calculate their risk score. Document a birth country in the Demographics activity for a more accurate score.         Last calculated by Rogerio Tempie SQUIBB, LPN on 5/83/7975 at  1:18 PM        A: BCCCP exam without pap smear Complaint of left breast lump.  P: Referred patient to the [facility] for a diagnostic mammogram. Appointment scheduled Wednesday, March 01, 2024 at 1420.  Driscilla Wanda SQUIBB, RN 02/29/2024 8:58 AM

## 2024-02-29 NOTE — Patient Instructions (Signed)
 Explained breast self awareness with Yolanda Parsons. Patient did not need a Pap smear today due to last Pap smear and HPV typing was 11/04/2021. Let her know BCCCP will cover Pap smears and HPV typing every 5 years unless has a history of abnormal Pap smears. Referred patient to the [facility] for a diagnostic mammogram. Appointment scheduled Wednesday, March 01, 2024 at 1420. Patient aware of appointment and will be there. Yolanda Parsons verbalized understanding.  Kenlie Seki, Wanda Ship, RN 8:58 AM

## 2024-03-01 ENCOUNTER — Ambulatory Visit
Admission: RE | Admit: 2024-03-01 | Discharge: 2024-03-01 | Disposition: A | Discharge status: Home / Self Care | Payer: Self-pay | Source: Ambulatory Visit | Attending: Obstetrics and Gynecology | Admitting: Obstetrics and Gynecology

## 2024-03-01 ENCOUNTER — Other Ambulatory Visit: Payer: Self-pay | Admitting: Obstetrics and Gynecology

## 2024-03-01 DIAGNOSIS — N6310 Unspecified lump in the right breast, unspecified quadrant: Secondary | ICD-10-CM | POA: Insufficient documentation

## 2024-03-01 DIAGNOSIS — N644 Mastodynia: Secondary | ICD-10-CM
